# Patient Record
Sex: Female | Born: 1980 | Race: Black or African American | Hispanic: No | Marital: Single | Smoking: Current every day smoker
Health system: Southern US, Community
[De-identification: ages and names within clinical notes are randomized; demographics above are authoritative.]

## PROBLEM LIST (undated history)

## (undated) ENCOUNTER — Emergency Department (HOSPITAL_COMMUNITY): Admission: EM | Payer: Medicaid Other | Source: Home / Self Care

## (undated) DIAGNOSIS — I10 Essential (primary) hypertension: Secondary | ICD-10-CM

---

## 2010-06-19 ENCOUNTER — Ambulatory Visit: Payer: Self-pay | Admitting: Diagnostic Radiology

## 2010-06-19 ENCOUNTER — Emergency Department (HOSPITAL_BASED_OUTPATIENT_CLINIC_OR_DEPARTMENT_OTHER): Admission: EM | Admit: 2010-06-19 | Discharge: 2010-06-19 | Payer: Self-pay | Admitting: Emergency Medicine

## 2011-05-13 ENCOUNTER — Emergency Department (INDEPENDENT_AMBULATORY_CARE_PROVIDER_SITE_OTHER): Payer: Self-pay

## 2011-05-13 ENCOUNTER — Emergency Department (HOSPITAL_BASED_OUTPATIENT_CLINIC_OR_DEPARTMENT_OTHER)
Admission: EM | Admit: 2011-05-13 | Discharge: 2011-05-13 | Disposition: A | Discharge status: Home / Self Care | Payer: Self-pay | Attending: Emergency Medicine | Admitting: Emergency Medicine

## 2011-05-13 DIAGNOSIS — J40 Bronchitis, not specified as acute or chronic: Secondary | ICD-10-CM | POA: Insufficient documentation

## 2011-05-13 DIAGNOSIS — R059 Cough, unspecified: Secondary | ICD-10-CM | POA: Insufficient documentation

## 2011-05-13 DIAGNOSIS — R05 Cough: Secondary | ICD-10-CM

## 2011-05-13 LAB — URINALYSIS, ROUTINE W REFLEX MICROSCOPIC
Glucose, UA: NEGATIVE mg/dL
Hgb urine dipstick: NEGATIVE
Ketones, ur: NEGATIVE mg/dL
Protein, ur: NEGATIVE mg/dL

## 2011-10-18 ENCOUNTER — Emergency Department (INDEPENDENT_AMBULATORY_CARE_PROVIDER_SITE_OTHER): Payer: Medicaid Other

## 2011-10-18 ENCOUNTER — Emergency Department (HOSPITAL_BASED_OUTPATIENT_CLINIC_OR_DEPARTMENT_OTHER)
Admission: EM | Admit: 2011-10-18 | Discharge: 2011-10-18 | Disposition: A | Discharge status: Home / Self Care | Payer: Medicaid Other | Attending: Emergency Medicine | Admitting: Emergency Medicine

## 2011-10-18 ENCOUNTER — Encounter: Payer: Self-pay | Admitting: *Deleted

## 2011-10-18 DIAGNOSIS — O2690 Pregnancy related conditions, unspecified, unspecified trimester: Secondary | ICD-10-CM | POA: Insufficient documentation

## 2011-10-18 DIAGNOSIS — M545 Low back pain: Secondary | ICD-10-CM

## 2011-10-18 DIAGNOSIS — M549 Dorsalgia, unspecified: Secondary | ICD-10-CM | POA: Insufficient documentation

## 2011-10-18 DIAGNOSIS — O9989 Other specified diseases and conditions complicating pregnancy, childbirth and the puerperium: Secondary | ICD-10-CM

## 2011-10-18 DIAGNOSIS — Z3490 Encounter for supervision of normal pregnancy, unspecified, unspecified trimester: Secondary | ICD-10-CM

## 2011-10-18 LAB — URINALYSIS, ROUTINE W REFLEX MICROSCOPIC
Nitrite: NEGATIVE
Specific Gravity, Urine: 1.02 (ref 1.005–1.030)

## 2011-10-18 LAB — WET PREP, GENITAL
Trich, Wet Prep: NONE SEEN
Yeast Wet Prep HPF POC: NONE SEEN

## 2011-10-18 NOTE — ED Provider Notes (Signed)
History     CSN: [id number] Arrival date & time: 10/18/2011  5:58 PM   First MD Initiated Contact with Patient 10/18/11 1810      Chief Complaint  Patient presents with  . Back Pain    (Consider location/radiation/quality/duration/timing/severity/associated sxs/prior treatment) HPI Comments: Pt states that she has had some pain that radiates to her lower abdomen:pt states that she had a positive pregnancy test at home:pt state that she hasn't had a period since September:pt states that this her second pregnancy and she has one live child:pt denies dysuria or bleeding  Patient is a 30 y.o. female presenting with back pain. The history is provided by the patient. No language interpreter was used.  Back Pain  This is a new problem. The current episode started more than 1 week ago. The problem occurs daily. The problem has not changed since onset.The pain is associated with no known injury. The pain is present in the lumbar spine. The quality of the pain is described as aching. The pain does not radiate. The pain is mild. The pain is the same all the time. Pertinent negatives include no bladder incontinence, no dysuria, no tingling and no weakness. She has tried nothing for the symptoms.    History reviewed. No pertinent past medical history.  History reviewed. No pertinent past surgical history.  No family history on file.  History  Substance Use Topics  . Smoking status: Current Everyday Smoker  . Smokeless tobacco: Not on file  . Alcohol Use: No    OB History    Grav Para Term Preterm Abortions TAB SAB Ect Mult Living                  Review of Systems  Genitourinary: Negative for bladder incontinence and dysuria.  Musculoskeletal: Positive for back pain.  Neurological: Negative for tingling and weakness.  All other systems reviewed and are negative.    Allergies  Review of patient's allergies indicates no known allergies.  Home Medications  No current outpatient  prescriptions on file.  BP 125/81  Pulse 94  Temp(Src) 97.8 F (36.6 C) (Oral)  Resp 20  SpO2 100%  LMP 09/06/2011  Physical Exam  Nursing note and vitals reviewed. Constitutional: She is oriented to person, place, and time. She appears well-developed and well-nourished.  HENT:  Head: Normocephalic and atraumatic.  Eyes: Pupils are equal, round, and reactive to light.  Neck: Normal range of motion.  Cardiovascular: Normal rate and regular rhythm.   Pulmonary/Chest: Effort normal and breath sounds normal.  Abdominal: Soft. There is no tenderness. There is no CVA tenderness.  Genitourinary: No vaginal discharge found.       Os is closed:no bleeding noted  Musculoskeletal: Normal range of motion.  Neurological: She is alert and oriented to person, place, and time.  Skin: Skin is dry.  Psychiatric: She has a normal mood and affect.    ED Course  Procedures (including critical care time)  Labs Reviewed  URINALYSIS, ROUTINE W REFLEX MICROSCOPIC - Abnormal; Notable for the following:    Appearance CLOUDY (*)    All other components within normal limits  WET PREP, GENITAL - Abnormal; Notable for the following:    Clue Cells, Wet Prep FEW (*)    WBC, Wet Prep HPF POC RARE (*)    All other components within normal limits  PREGNANCY, URINE  GC/CHLAMYDIA PROBE AMP, GENITAL   US Ob Comp Less 14 Wks  10/18/2011  *RADIOLOGY REPORT*  Clinical Data: Positive  pregnancy test, unsure dates.  Low back pain  OBSTETRIC <14 WK [place] AND TRANSVAGINAL OB US  Technique:  Both transabdominal and transvaginal ultrasound examinations were performed for complete evaluation of the gestation as well as the maternal uterus, adnexal regions, and pelvic cul-de-sac.  Transvaginal technique was performed to assess early pregnancy.  Comparison:  None.  Intrauterine gestational sac:  Visualized/normal in shape Yolk sac: Visualized Embryo: Visualized Cardiac Activity: Visualized Heart Rate: 165 bpm  CRL: 2   mm  5    w  5   d           [place] EDC: 06/14/12  Maternal uterus/adnexae: The ovaries are normal bilaterally  IMPRESSION: Intrauterine gestational sac, yolk sac, fetal pole, and cardiac activity identified.  Gestational age [redacted] weeks 5 days by crown-rump length, EDC by ultrasound today 06/14/2012.  No acute abnormality.  Original Report Authenticated By: Harrel Lemon, M.D.   US Ob Transvaginal  10/18/2011  *RADIOLOGY REPORT*  Clinical Data: Positive pregnancy test, unsure dates.  Low back pain  OBSTETRIC <14 WK [place] AND TRANSVAGINAL OB US  Technique:  Both transabdominal and transvaginal ultrasound examinations were performed for complete evaluation of the gestation as well as the maternal uterus, adnexal regions, and pelvic cul-de-sac.  Transvaginal technique was performed to assess early pregnancy.  Comparison:  None.  Intrauterine gestational sac:  Visualized/normal in shape Yolk sac: Visualized Embryo: Visualized Cardiac Activity: Visualized Heart Rate: 165 bpm  CRL: 2   mm  5   w  5   d           [place] EDC: 06/14/12  Maternal uterus/adnexae: The ovaries are normal bilaterally  IMPRESSION: Intrauterine gestational sac, yolk sac, fetal pole, and cardiac activity identified.  Gestational age [redacted] weeks 5 days by crown-rump length, EDC by ultrasound today 06/14/2012.  No acute abnormality.  Original Report Authenticated By: Harrel Lemon, M.D.     1. Normal IUP (intrauterine pregnancy) on prenatal ultrasound       MDM  Consistent with ZOX:WRUE normal:pt okay to start getting prenatal care        Teressa Lower, NP 10/18/11 1951

## 2011-10-18 NOTE — ED Notes (Signed)
Pt c/o low back pain x2-3 weeks. She sts she took a home pregnancy test today and the result was positive.

## 2011-10-18 NOTE — ED Provider Notes (Signed)
Medical screening examination/treatment/procedure(s) were performed by non-physician practitioner and as supervising physician I was immediately available for consultation/collaboration.  Geoffery Lyons, MD 10/18/11 2142

## 2011-10-19 LAB — GC/CHLAMYDIA PROBE AMP, GENITAL
Chlamydia, DNA Probe: NEGATIVE
GC Probe Amp, Genital: NEGATIVE

## 2013-01-16 ENCOUNTER — Emergency Department (HOSPITAL_BASED_OUTPATIENT_CLINIC_OR_DEPARTMENT_OTHER)
Admission: EM | Admit: 2013-01-16 | Discharge: 2013-01-16 | Disposition: A | Discharge status: Home / Self Care | Payer: Medicaid Other | Attending: Emergency Medicine | Admitting: Emergency Medicine

## 2013-01-16 ENCOUNTER — Encounter (HOSPITAL_BASED_OUTPATIENT_CLINIC_OR_DEPARTMENT_OTHER): Payer: Self-pay | Admitting: Emergency Medicine

## 2013-01-16 DIAGNOSIS — H6690 Otitis media, unspecified, unspecified ear: Secondary | ICD-10-CM | POA: Insufficient documentation

## 2013-01-16 DIAGNOSIS — F17200 Nicotine dependence, unspecified, uncomplicated: Secondary | ICD-10-CM | POA: Insufficient documentation

## 2013-01-16 MED ORDER — AMOXICILLIN 500 MG PO CAPS: 500.0000 mg | ORAL_CAPSULE | Freq: Three times a day (TID) | ORAL | Status: DC

## 2013-01-16 MED ORDER — HYDROCODONE-ACETAMINOPHEN 5-325 MG PO TABS: 2.0000 | ORAL_TABLET | ORAL | Status: AC | PRN

## 2013-01-16 NOTE — ED Provider Notes (Signed)
Medical screening examination/treatment/procedure(s) were performed by non-physician practitioner and as supervising physician I was immediately available for consultation/collaboration.   Celene Kras, MD 01/16/13 2116

## 2013-01-16 NOTE — ED Provider Notes (Signed)
History     CSN: [id number]  Arrival date & time 01/16/13  1919   First MD Initiated Contact with Patient 01/16/13 2025      Chief Complaint  Patient presents with  . Otalgia    (Consider location/radiation/quality/duration/timing/severity/associated sxs/prior treatment) Patient is a 32 y.o. female presenting with ear pain. The history is provided by the patient. No language interpreter was used.  Otalgia This is a new problem. The current episode started 2 days ago. There is pain in the right ear. The problem occurs constantly. The problem has been gradually worsening. There has been no fever. The pain is at a severity of 5/10. The pain is moderate.    History reviewed. No pertinent past medical history.  History reviewed. No pertinent past surgical history.  History reviewed. No pertinent family history.  History  Substance Use Topics  . Smoking status: Current Every Day Smoker -- 0.5 packs/day    Types: Cigarettes  . Smokeless tobacco: Not on file  . Alcohol Use: No    OB History    Grav Para Term Preterm Abortions TAB SAB Ect Mult Living                  Review of Systems  HENT: Positive for ear pain.   All other systems reviewed and are negative.    Allergies  Review of patient's allergies indicates no known allergies.  Home Medications  No current outpatient prescriptions on file.  BP 141/94  Pulse 77  Temp 98.3 F (36.8 C) (Oral)  Resp 18  Ht 5\' 4"  (1.626 m)  Wt 200 lb (90.719 kg)  BMI 34.33 kg/m2  SpO2 100%  LMP 01/02/2013  Physical Exam  Nursing note and vitals reviewed. Constitutional: She appears well-developed and well-nourished.  HENT:  Head: Normocephalic and atraumatic.       Right tm,  Erythema bulging   Pain with opening and closing jaw.  Tender tmj area  Eyes: Pupils are equal, round, and reactive to light.  Neck: Normal range of motion. Neck supple.  Cardiovascular: Normal rate and normal heart sounds.   Pulmonary/Chest: Effort  normal.  Musculoskeletal: Normal range of motion.  Neurological: She is alert. She has normal reflexes.  Skin: Skin is warm.  Psychiatric: She has a normal mood and affect.    ED Course  Procedures (including critical care time)  Labs Reviewed - No data to display No results found.   No diagnosis found.    MDM  Pt given rx for amoxicillins and hydrocodone Pt advised follow up with Dr. Suszanne Conners for recheck if pain persist        Lonia Skinner [employer], [place] 01/16/13 2111

## 2013-01-16 NOTE — ED Notes (Signed)
Pt reports that she developed ear pain on Sunday, but ear only hurts when she swallows, pt reports having tooth pulled on left upper side of mouth on Friday., no drainage, no fevers, + cough, + nasal congestion

## 2013-01-16 NOTE — ED Notes (Signed)
ED PAC at bedside. 

## 2017-01-05 ENCOUNTER — Emergency Department (HOSPITAL_COMMUNITY): Payer: Medicaid Other

## 2017-01-05 ENCOUNTER — Emergency Department (HOSPITAL_COMMUNITY)
Admission: EM | Admit: 2017-01-05 | Discharge: 2017-01-05 | Disposition: A | Discharge status: Home / Self Care | Payer: Medicaid Other | Attending: Emergency Medicine | Admitting: Emergency Medicine

## 2017-01-05 ENCOUNTER — Encounter (HOSPITAL_COMMUNITY): Payer: Self-pay | Admitting: *Deleted

## 2017-01-05 DIAGNOSIS — F17210 Nicotine dependence, cigarettes, uncomplicated: Secondary | ICD-10-CM | POA: Diagnosis not present

## 2017-01-05 DIAGNOSIS — J069 Acute upper respiratory infection, unspecified: Secondary | ICD-10-CM | POA: Diagnosis not present

## 2017-01-05 DIAGNOSIS — R05 Cough: Secondary | ICD-10-CM | POA: Diagnosis present

## 2017-01-05 MED ORDER — BENZONATATE 100 MG PO CAPS: 100.0000 mg | ORAL_CAPSULE | Freq: Three times a day (TID) | ORAL | 0 refills | Status: DC

## 2017-01-05 MED ORDER — IBUPROFEN 800 MG PO TABS
800.0000 mg | ORAL_TABLET | Freq: Three times a day (TID) | ORAL | 0 refills | Status: DC
Start: 2017-01-05 — End: 2018-08-24

## 2017-01-05 MED ORDER — HYDROCHLOROTHIAZIDE 25 MG PO TABS
25.0000 mg | ORAL_TABLET | Freq: Every day | ORAL | 0 refills | Status: DC
Start: 2017-01-05 — End: 2018-08-24

## 2017-01-05 NOTE — ED Notes (Signed)
Called pt x4 with no answer.  

## 2017-01-05 NOTE — ED Notes (Signed)
Pt is in stable condition upon d/c and ambulates from ED. 

## 2017-01-05 NOTE — ED Notes (Signed)
Pt brought to room from xray.

## 2017-01-05 NOTE — Discharge Instructions (Signed)
Your symptoms are consistent with a viral illness. Viruses do not require antibiotics. Treatment is symptomatic care and it is important to note that these symptoms may last for 7-14 days. Symptoms will be intensified and complicated by dehydration. Dehydration can also extend the duration of symptoms. Drink plenty of fluids and get plenty of rest. You should be drinking at least half a liter of water an hour to stay hydrated. Electrolyte drinks are also encouraged. You should be drinking enough fluids to make your urine light yellow, almost clear. If this is not the case, you are not drinking enough water. Ibuprofen, Naproxen, or Tylenol for pain or fever. Tessalon for cough. Plain Mucinex may help relieve congestion. Saline sinus rinses and saline nasal sprays may also help relieve congestion. Warm liquids or Chloraseptic spray may help soothe a sore throat. Follow up with a primary care provider, as needed, for any future management of this issue. °

## 2017-01-05 NOTE — ED Provider Notes (Signed)
[facility] Provider Note   CSN: [id number] Arrival date & time: 01/05/17  1528  By signing my name below, I, Vista Mink, attest that this documentation has been prepared under the direction and in the presence of Shawn Joy PA-C.  Electronically Signed: Vista Mink, ED Scribe. 01/05/17. 5:27 PM.  History   Chief Complaint Chief Complaint  Patient presents with  . Cough    HPI HPI Comments: Brooke Barker is a 36 y.o. female who presents to the Emergency Department complaining of persistent productive cough with green sputum that started two days ago. Pt also notes nasal congestion and mild sore throat. Pt reports exacerbation of pain in her chest wall during episodes of repeated coughing. She took OTC Theraflu two days ago with mild relief of symptoms. She is also requesting a refill of her HCTZ 25mg  as she is new to the area and has not yet established a PCP. Denies fever/chills, nausea/vomiting, shortness of breath, or any other complaints.    The history is provided by the patient. No language interpreter was used.   History reviewed. No pertinent past medical history.  There are no active problems to display for this patient.   History reviewed. No pertinent surgical history.  OB History    No data available       Home Medications    Prior to Admission medications   Medication Sig Start Date End Date Taking? Authorizing Provider  amoxicillin (AMOXIL) 500 MG capsule Take 1 capsule (500 mg total) by mouth 3 (three) times daily. 01/16/13   Elson Areas, PA-C  benzonatate (TESSALON) 100 MG capsule Take 1 capsule (100 mg total) by mouth every 8 (eight) hours. 01/05/17   Shawn C Joy, PA-C  hydrochlorothiazide (HYDRODIURIL) 25 MG tablet Take 1 tablet (25 mg total) by mouth daily. 01/05/17   Shawn C Joy, PA-C  ibuprofen (ADVIL,MOTRIN) 800 MG tablet Take 1 tablet (800 mg total) by mouth 3 (three) times daily. 01/05/17   Anselm Pancoast, PA-C    Family History History  reviewed. No pertinent family history.  Social History Social History  Substance Use Topics  . Smoking status: Current Every Day Smoker    Packs/day: 0.50    Types: Cigarettes  . Smokeless tobacco: Not on file  . Alcohol use No   Allergies   Patient has no known allergies.   Review of Systems Review of Systems  Constitutional: Negative for chills and fever.  HENT: Positive for congestion and sore throat.   Respiratory: Positive for cough. Negative for shortness of breath.   Gastrointestinal: Negative for nausea and vomiting.  All other systems reviewed and are negative.  Physical Exam Updated Vital Signs BP 150/95 (BP Location: Left Arm)   Pulse 94   Temp 98.6 F (37 C) (Oral)   Resp 18   LMP 12/27/2016   SpO2 100%   Physical Exam  Constitutional: She appears well-developed and well-nourished. No distress.  HENT:  Head: Normocephalic and atraumatic.  Eyes: Conjunctivae are normal.  Neck: Neck supple.  Cardiovascular: Normal rate, regular rhythm, normal heart sounds and intact distal pulses.   Pulmonary/Chest: Effort normal and breath sounds normal. No respiratory distress.  Abdominal: Soft. There is no tenderness. There is no guarding.  Musculoskeletal: She exhibits no edema.  Lymphadenopathy:    She has no cervical adenopathy.  Neurological: She is alert.  Skin: Skin is warm and dry. She is not diaphoretic.  Psychiatric: She has a normal mood and affect. Her behavior is normal.  Nursing note and vitals reviewed.   ED Treatments / Results  DIAGNOSTIC STUDIES: Oxygen Saturation is 100% on RA, normal by my interpretation.  COORDINATION OF CARE: 5:26 PM-Discussed treatment plan with pt at bedside and pt agreed to plan.   Labs (all labs ordered are listed, but only abnormal results are displayed) Labs Reviewed - No data to display  EKG  EKG Interpretation None       Radiology No results found.  Procedures Procedures (including critical care  time)  Medications Ordered in ED Medications - No data to display   Initial Impression / Assessment and Plan / ED Course  I have reviewed the triage vital signs and the nursing notes.  Pertinent labs & imaging results that were available during my care of the patient were reviewed by me and considered in my medical decision making (see chart for details).      Patient presents with symptoms consistent with URI versus influenza-like illness. Negative chest x-ray. Supportive care and return precautions discussed. Patient's medication refilled and resources given for PCP follow-up.     Final Clinical Impressions(s) / ED Diagnoses   Final diagnoses:  Upper respiratory tract infection, unspecified type    New Prescriptions Discharge Medication List as of 01/05/2017  5:33 PM    START taking these medications   Details  benzonatate (TESSALON) 100 MG capsule Take 1 capsule (100 mg total) by mouth every 8 (eight) hours., Starting Wed 01/05/2017, Print    hydrochlorothiazide (HYDRODIURIL) 25 MG tablet Take 1 tablet (25 mg total) by mouth daily., Starting Wed 01/05/2017, Print    ibuprofen (ADVIL,MOTRIN) 800 MG tablet Take 1 tablet (800 mg total) by mouth 3 (three) times daily., Starting Wed 01/05/2017, Print      I personally performed the services described in this documentation, which was scribed in my presence. The recorded information has been reviewed and is accurate.    Anselm PancoastShawn C Joy, PA-C 01/08/17 0229    Lavera Guiseana Duo Liu, MD 01/11/17 [phone]

## 2017-01-05 NOTE — ED Triage Notes (Signed)
Pt reports having a headache, productive cough, sore throat, bodyaches for several days. Back pain with breathing and cough. Airway intact with no acute distress.

## 2018-08-24 ENCOUNTER — Encounter (HOSPITAL_COMMUNITY): Payer: Self-pay

## 2018-08-24 ENCOUNTER — Ambulatory Visit (HOSPITAL_COMMUNITY)
Admission: EM | Admit: 2018-08-24 | Discharge: 2018-08-24 | Disposition: A | Discharge status: Home / Self Care | Payer: BLUE CROSS/BLUE SHIELD | Attending: Family Medicine | Admitting: Family Medicine

## 2018-08-24 DIAGNOSIS — N898 Other specified noninflammatory disorders of vagina: Secondary | ICD-10-CM | POA: Diagnosis present

## 2018-08-24 DIAGNOSIS — R42 Dizziness and giddiness: Secondary | ICD-10-CM | POA: Insufficient documentation

## 2018-08-24 DIAGNOSIS — J029 Acute pharyngitis, unspecified: Secondary | ICD-10-CM | POA: Insufficient documentation

## 2018-08-24 DIAGNOSIS — J069 Acute upper respiratory infection, unspecified: Secondary | ICD-10-CM | POA: Diagnosis present

## 2018-08-24 DIAGNOSIS — R05 Cough: Secondary | ICD-10-CM | POA: Diagnosis present

## 2018-08-24 DIAGNOSIS — R51 Headache: Secondary | ICD-10-CM | POA: Diagnosis not present

## 2018-08-24 DIAGNOSIS — B9689 Other specified bacterial agents as the cause of diseases classified elsewhere: Secondary | ICD-10-CM | POA: Diagnosis not present

## 2018-08-24 DIAGNOSIS — N760 Acute vaginitis: Secondary | ICD-10-CM | POA: Diagnosis not present

## 2018-08-24 DIAGNOSIS — Z87440 Personal history of urinary (tract) infections: Secondary | ICD-10-CM | POA: Insufficient documentation

## 2018-08-24 DIAGNOSIS — F17210 Nicotine dependence, cigarettes, uncomplicated: Secondary | ICD-10-CM | POA: Diagnosis not present

## 2018-08-24 DIAGNOSIS — I10 Essential (primary) hypertension: Secondary | ICD-10-CM | POA: Diagnosis not present

## 2018-08-24 DIAGNOSIS — R300 Dysuria: Secondary | ICD-10-CM | POA: Diagnosis not present

## 2018-08-24 LAB — POCT URINALYSIS DIP (DEVICE)
Glucose, UA: NEGATIVE mg/dL
HGB URINE DIPSTICK: NEGATIVE
KETONES UR: NEGATIVE mg/dL
Leukocytes, UA: NEGATIVE
Nitrite: NEGATIVE
PH: 5.5 (ref 5.0–8.0)
PROTEIN: NEGATIVE mg/dL
SPECIFIC GRAVITY, URINE: 1.025 (ref 1.005–1.030)
Urobilinogen, UA: 1 mg/dL (ref 0.0–1.0)

## 2018-08-24 LAB — POCT RAPID STREP A: Streptococcus, Group A Screen (Direct): NEGATIVE

## 2018-08-24 MED ORDER — LISINOPRIL-HYDROCHLOROTHIAZIDE 10-12.5 MG PO TABS: 1.0000 | ORAL_TABLET | Freq: Every day | ORAL | 0 refills | Status: DC

## 2018-08-24 MED ORDER — METRONIDAZOLE 500 MG PO TABS: 500.0000 mg | ORAL_TABLET | Freq: Two times a day (BID) | ORAL | 0 refills | Status: DC

## 2018-08-24 NOTE — ED Triage Notes (Signed)
Pt presents with urinary tract infection symptoms; urine discolored, foul smell, discharge, frequent urination and pressure.

## 2018-08-24 NOTE — ED Provider Notes (Signed)
[facility]    CSN: [id number] Arrival date & time: 08/24/18  0854     History   Chief Complaint Chief Complaint  Patient presents with  . URI  . Urinary Tract Infection    HPI Brooke Barker is a 37 y.o. female.   HPI  Patient is here for 2 problems.  First she is had cough cold runny nose and sore throat for 2 days.  No fever chills.  No nausea or vomiting.  No exposure to anyone with illness or strep.  She has been using over-the-counter medication.  The symptoms are stable, not improving.  No sputum production.  No chest pain.  Underlying asthma or allergies. She also has a vaginal discharge, strong vaginal odor, and mild dysuria.  She absolutely denies exposure to sexually transmitted disease.  She has some frequency.  She wonders if she has a bladder or vaginal infection.  She has no pelvic pain. She mentions that she is out of her blood pressure medication.  She would like to have a refill of her medicine until she can get back in with her PCP.  She states she has had a change in insurance, and has not taken her medicine for about 2 weeks.  Headaches, dizzy spells, problems with symptomatic hypertension.  History reviewed. No pertinent past medical history.  There are no active problems to display for this patient.   History reviewed. No pertinent surgical history.  OB History   None      Home Medications    Prior to Admission medications   Medication Sig Start Date End Date Taking? Authorizing Provider  lisinopril-hydrochlorothiazide (ZESTORETIC) 10-12.5 MG tablet Take 1 tablet by mouth daily. 08/24/18   Eustace MooreNelson, Christinea Brizuela Sue, MD  metroNIDAZOLE (FLAGYL) 500 MG tablet Take 1 tablet (500 mg total) by mouth 2 (two) times daily. 08/24/18   Eustace MooreNelson, Khyrin Trevathan Sue, MD    Family History History reviewed. No pertinent family history.  Social History Social History   Tobacco Use  . Smoking status: Current Every Day Smoker    Packs/day: 0.50    Types: Cigarettes   Substance Use Topics  . Alcohol use: No  . Drug use: No     Allergies   Patient has no known allergies.   Review of Systems Review of Systems  Constitutional: Negative for appetite change, chills and fever.  HENT: Positive for congestion, postnasal drip, rhinorrhea and sore throat. Negative for ear pain and trouble swallowing.   Eyes: Negative for pain, redness and visual disturbance.  Respiratory: Positive for cough. Negative for shortness of breath.   Cardiovascular: Negative for chest pain and palpitations.  Gastrointestinal: Negative for abdominal pain, diarrhea, nausea and vomiting.  Genitourinary: Positive for dysuria and vaginal discharge. Negative for frequency, hematuria and pelvic pain.  Musculoskeletal: Negative for arthralgias and back pain.  Skin: Negative for color change and rash.  Neurological: Negative for seizures and syncope.  All other systems reviewed and are negative.    Physical Exam Triage Vital Signs ED Triage Vitals [08/24/18 0924]  Enc Vitals Group     BP (!) 167/103     Pulse Rate 98     Resp 18     Temp 98.5 F (36.9 C)     Temp Source Oral     SpO2 99 %     Weight    No data found.  Updated Vital Signs BP (!) 167/103 (BP Location: Left Arm)   Pulse 98   Temp 98.5 F (36.9  C) (Oral)   Resp 18   LMP 08/01/2018   SpO2 99%       Physical Exam  Constitutional: She appears well-developed and well-nourished. No distress.  HENT:  Head: Normocephalic and atraumatic.  Right Ear: External ear normal.  Left Ear: External ear normal.  Mouth/Throat: Oropharynx is clear and moist.  Nasal membranes congested.  Clear rhinorrhea.  Posterior pharynx is benign.  No adenopathy.  Eyes: Pupils are equal, round, and reactive to light. Conjunctivae are normal.  Neck: Normal range of motion.  Cardiovascular: Normal rate, regular rhythm and normal heart sounds.  Pulmonary/Chest: Effort normal and breath sounds normal. No respiratory distress. She  has no wheezes. She has no rales.  Abdominal: Soft. Bowel sounds are normal. She exhibits no distension. There is no tenderness.  Abdomen soft and benign  Musculoskeletal: Normal range of motion. She exhibits no edema.  Lymphadenopathy:    She has no cervical adenopathy.  Neurological: She is alert.  Skin: Skin is warm and dry.  Psychiatric: She has a normal mood and affect. Her behavior is normal.     UC Treatments / Results  Labs (all labs ordered are listed, but only abnormal results are displayed) Labs Reviewed  POCT URINALYSIS DIP (DEVICE) - Abnormal; Notable for the following components:      Result Value   Bilirubin Urine SMALL (*)    All other components within normal limits  URINE CULTURE  POCT RAPID STREP A    EKG None  Radiology No results found.  Procedures Procedures (including critical care time)  Medications Ordered in UC Medications - No data to display  Initial Impression / Assessment and Plan / UC Course  I have reviewed the triage vital signs and the nursing notes.  Pertinent labs & imaging results that were available during my care of the patient were reviewed by me and considered in my medical decision making (see chart for details).     Discussed BV.  Treatment.  Causes.  Reasons for return. Discussed viral URI.  Symptomatic treatment.  That antibiotics for the URI will not make her get better faster and can be harmful. Final Clinical Impressions(s) / UC Diagnoses   Final diagnoses:  BV (bacterial vaginosis)  Acute upper respiratory infection  Essential hypertension     Discharge Instructions     Drink plenty of fluids.  Take cold medicine as needed.  The symptoms should go away in a couple days. Take the antibiotic twice a day for 10 days.  This is for bacterial vaginosis. Do not drink alcohol while on the antibiotic.  Avoid sexual relations to you have completed your treatment. I have refilled your lisinopril blood pressure  medication.  I have placed a PCP referral to help you find a doctor. Return here as needed   ED Prescriptions    Medication Sig Dispense Auth. Provider   lisinopril-hydrochlorothiazide (ZESTORETIC) 10-12.5 MG tablet Take 1 tablet by mouth daily. 30 tablet Eustace Moore, MD   metroNIDAZOLE (FLAGYL) 500 MG tablet Take 1 tablet (500 mg total) by mouth 2 (two) times daily. 14 tablet Eustace Moore, MD     Controlled Substance Prescriptions [place] Controlled Substance Registry consulted? Not Applicable   Eustace Moore, MD 08/24/18 1017

## 2018-08-24 NOTE — ED Triage Notes (Signed)
Pt presents with cold symptoms; nasal drainage, facial pain, congestion,sore throat and persistent cough

## 2018-08-24 NOTE — Discharge Instructions (Signed)
Drink plenty of fluids.  Take cold medicine as needed.  The symptoms should go away in a couple days. Take the antibiotic twice a day for 10 days.  This is for bacterial vaginosis. Do not drink alcohol while on the antibiotic.  Avoid sexual relations to you have completed your treatment. I have refilled your lisinopril blood pressure medication.  I have placed a PCP referral to help you find a doctor. Return here as needed

## 2018-08-25 LAB — URINE CULTURE

## 2018-12-27 ENCOUNTER — Emergency Department (HOSPITAL_COMMUNITY)
Admission: EM | Admit: 2018-12-27 | Discharge: 2018-12-27 | Disposition: A | Discharge status: Home / Self Care | Payer: BLUE CROSS/BLUE SHIELD | Attending: Emergency Medicine | Admitting: Emergency Medicine

## 2018-12-27 ENCOUNTER — Other Ambulatory Visit: Payer: Self-pay

## 2018-12-27 ENCOUNTER — Encounter (HOSPITAL_COMMUNITY): Payer: Self-pay | Admitting: Emergency Medicine

## 2018-12-27 DIAGNOSIS — F17210 Nicotine dependence, cigarettes, uncomplicated: Secondary | ICD-10-CM | POA: Diagnosis not present

## 2018-12-27 DIAGNOSIS — I10 Essential (primary) hypertension: Secondary | ICD-10-CM | POA: Insufficient documentation

## 2018-12-27 DIAGNOSIS — R05 Cough: Secondary | ICD-10-CM | POA: Diagnosis present

## 2018-12-27 DIAGNOSIS — R69 Illness, unspecified: Secondary | ICD-10-CM

## 2018-12-27 DIAGNOSIS — Z79899 Other long term (current) drug therapy: Secondary | ICD-10-CM | POA: Insufficient documentation

## 2018-12-27 DIAGNOSIS — J111 Influenza due to unidentified influenza virus with other respiratory manifestations: Secondary | ICD-10-CM | POA: Insufficient documentation

## 2018-12-27 MED ORDER — BENZONATATE 100 MG PO CAPS: 100.0000 mg | ORAL_CAPSULE | Freq: Three times a day (TID) | ORAL | 0 refills | Status: DC

## 2018-12-27 MED ORDER — LISINOPRIL-HYDROCHLOROTHIAZIDE 10-12.5 MG PO TABS: 1.0000 | ORAL_TABLET | Freq: Every day | ORAL | 0 refills | Status: DC

## 2018-12-27 MED ORDER — ONDANSETRON 4 MG PO TBDP: 4.0000 mg | ORAL_TABLET | Freq: Three times a day (TID) | ORAL | 0 refills | Status: DC | PRN

## 2018-12-27 MED ORDER — OSELTAMIVIR PHOSPHATE 75 MG PO CAPS: 75.0000 mg | ORAL_CAPSULE | Freq: Two times a day (BID) | ORAL | 0 refills | Status: DC

## 2018-12-27 MED ORDER — ACETAMINOPHEN 500 MG PO TABS
1000.0000 mg | ORAL_TABLET | Freq: Once | ORAL | Status: AC
  Administered 2018-12-27: 1000 mg via ORAL
  Filled 2018-12-27: qty 2

## 2018-12-27 NOTE — Discharge Instructions (Signed)
You can take Tylenol or Ibuprofen as directed for pain. You can alternate Tylenol and Ibuprofen every 4 hours. If you take Tylenol at 1pm, then you can take Ibuprofen at 5pm. Then you can take Tylenol again at 9pm.   You can take Tamiflu as directed.  This may make you have some GI symptoms.  I provided Zofran to help with nausea.  Make sure you are drinking plenty of fluids and staying hydrated.  As we discussed, follow-up with the [facility] to establish primary care.  I provided you with a repeat PT prescription of your blood pressure medication.  Please follow-up with the [facility] to get this refilled.  Return emergency department for any difficulty breathing, worsening chest pain, fever despite medications, vomiting or any other worsening or concerning symptoms.

## 2018-12-27 NOTE — ED Triage Notes (Signed)
Patient c/o cough, congestion, body aches and headaches x2 days. Denies N/V/D.

## 2018-12-27 NOTE — ED Provider Notes (Signed)
[facility]-EMERGENCY DEPT Provider Note   CSN: [id number] Arrival date & time: 12/27/18  1400     History   Chief Complaint Chief Complaint  Patient presents with  . Cough    HPI Brooke Barker is a 38 y.o. female past 1 history of hypertension who presents for evaluation of 2 days of generalized body aches, cough, congestion, chest soreness.  Patient reports that she was fine the day before her symptoms began and that they suddenly started.  Patient states cough is not productive.  She has not measured any fever.  Patient does report that she has chest soreness with coughing.  She reports it is slightly sore at rest but no chest pain or difficulty breathing at rest.  Patient reports she is tried NyQuil and Alka-Seltzer without any improvement in symptoms.  Patient states she has not had any nausea, vomiting, diarrhea but does endorse decreased appetite.  She reports that she is drinking fluids without any difficulty.  Patient states that she does smoke approximately half pack of cigarettes a day.  She does not vape.  She denies any history of asthma. She denies any OCP use, recent immobilization, prior history of DVT/PE, recent surgery, leg swelling, or long travel.  The history is provided by the patient.    History reviewed. No pertinent past medical history.  There are no active problems to display for this patient.   History reviewed. No pertinent surgical history.   OB History   No obstetric history on file.      Home Medications    Prior to Admission medications   Medication Sig Start Date End Date Taking? Authorizing Provider  benzonatate (TESSALON) 100 MG capsule Take 1 capsule (100 mg total) by mouth every 8 (eight) hours. 12/27/18   Maxwell CaulLayden,  A, PA-C  lisinopril-hydrochlorothiazide (ZESTORETIC) 10-12.5 MG tablet Take 1 tablet by mouth daily. 12/27/18   Maxwell CaulLayden,  A, PA-C  metroNIDAZOLE (FLAGYL) 500 MG tablet Take 1 tablet (500 mg  total) by mouth 2 (two) times daily. 08/24/18   Eustace MooreNelson, Yvonne Sue, MD  ondansetron (ZOFRAN ODT) 4 MG disintegrating tablet Take 1 tablet (4 mg total) by mouth every 8 (eight) hours as needed for nausea or vomiting. 12/27/18   Maxwell CaulLayden,  A, PA-C  oseltamivir (TAMIFLU) 75 MG capsule Take 1 capsule (75 mg total) by mouth every 12 (twelve) hours. 12/27/18   Maxwell CaulLayden,  A, PA-C    Family History No family history on file.  Social History Social History   Tobacco Use  . Smoking status: Current Every Day Smoker    Packs/day: 0.50    Types: Cigarettes  Substance Use Topics  . Alcohol use: No  . Drug use: No     Allergies   Patient has no known allergies.   Review of Systems Review of Systems  Constitutional: Positive for appetite change and fatigue. Negative for fever.  HENT: Positive for congestion and rhinorrhea.   Respiratory: Positive for cough. Negative for shortness of breath.   Cardiovascular: Negative for chest pain.  Gastrointestinal: Negative for abdominal pain, nausea and vomiting.  Musculoskeletal: Positive for myalgias.  Neurological: Negative for headaches.  All other systems reviewed and are negative.    Physical Exam Updated Vital Signs BP (!) 128/96 (BP Location: Right Arm)   Pulse 92   Temp (!) 103 F (39.4 C) (Oral)   Resp 20   Ht 5\' 4"  (1.626 m)   Wt 101.2 kg   LMP 12/15/2018 (Approximate)   SpO2  98%   BMI 38.28 kg/m   Physical Exam Vitals signs and nursing note reviewed.  Constitutional:      Appearance: She is well-developed.  HENT:     Head: Normocephalic and atraumatic.     Nose: Congestion present.     Comments: Edematous and erythematous nasal turbinates bilaterally.    Mouth/Throat:     Mouth: Mucous membranes are moist.     Pharynx: Oropharynx is clear.     Comments: Uvula is midline.  No trismus. Airway is patent, phonation is intact. Eyes:     General: No scleral icterus.       Right eye: No discharge.        Left eye: No  discharge.     Conjunctiva/sclera: Conjunctivae normal.  Cardiovascular:     Rate and Rhythm: Normal rate and regular rhythm.     Pulses:          Radial pulses are 2+ on the right side and 2+ on the left side.  Pulmonary:     Effort: Pulmonary effort is normal.     Breath sounds: Normal breath sounds.     Comments: Lungs clear to auscultation bilaterally.  Symmetric chest rise.  No wheezing, rales, rhonchi. Chest:     Chest wall: Tenderness present.     Comments: Tenderness palpation of the midsternal chest area.  No deformity or crepitus noted. Skin:    General: Skin is warm and dry.  Neurological:     Mental Status: She is alert.  Psychiatric:        Speech: Speech normal.        Behavior: Behavior normal.      ED Treatments / Results  Labs (all labs ordered are listed, but only abnormal results are displayed) Labs Reviewed - No data to display  EKG None  Radiology No results found.  Procedures Procedures (including critical care time)  Medications Ordered in ED Medications  acetaminophen (TYLENOL) tablet 1,000 mg (1,000 mg Oral Given 12/27/18 1700)     Initial Impression / Assessment and Plan / ED Course  I have reviewed the triage vital signs and the nursing notes.  Pertinent labs & imaging results that were available during my care of the patient were reviewed by me and considered in my medical decision making (see chart for details).     38 year old female who presents for evaluation of 2 days of cough, body aches, congestion, chest soreness.  Reports that she was fine prior to onset of symptoms.  No difficulty breathing.  She does smoke approximately half pack of cigarettes a day. Patient is afebrile, non-toxic appearing, sitting comfortably on examination table. Vital signs reviewed and stable.  Patient is hypertensive.  She does have a history of hypertension states she ran out of her blood pressure medications.  Consider influenzaI.  History/physical exam  not concerning for pneumonia.  History/physical exam not concerning for ACS etiology, PE.  Discussed with patient that she may have some groove costochondritis from coughing, additionally pain is reproduced on palpation.  Given that patient is in the 48-hour window of treatment, did offer Tamiflu.  Instructed patient on at home supportive care measures. At this time, patient exhibits no emergent life-threatening condition that require further evaluation in ED or admission.  Patient also requesting refill of her blood pressure medication.  Patient with bottle of lisinopril-HCTZ with her.  Was last filled on 11/14/2018.  Patient reports that she has not been able to get a primary care since then.  I discussed with patient that I could give her a short course refill but she will need to follow-up with her of primary care doctors for further dosages.  We will give her Coumadin wellness clinic for further evaluation. Patient had ample opportunity for questions and discussion. All patient's questions were answered with full understanding. Strict return precautions discussed. Patient expresses understanding and agreement to plan.    RN informed me that on patient's repeat vitals, patient now with fever.  Patient given Tylenol here in the ED and encourage p.o. fluids.  Portions of this note were generated with Scientist, clinical (histocompatibility and immunogenetics)Dragon dictation software. Dictation errors may occur despite best attempts at proofreading.   Final Clinical Impressions(s) / ED Diagnoses   Final diagnoses:  Influenza-like illness  Essential hypertension    ED Discharge Orders         Ordered    lisinopril-hydrochlorothiazide (ZESTORETIC) 10-12.5 MG tablet  Daily     12/27/18 1639    benzonatate (TESSALON) 100 MG capsule  Every 8 hours     12/27/18 1639    oseltamivir (TAMIFLU) 75 MG capsule  Every 12 hours     12/27/18 1639    ondansetron (ZOFRAN ODT) 4 MG disintegrating tablet  Every 8 hours PRN     12/27/18 1639           Rosana HoesLayden,   A, PA-C 12/27/18 2047    Benjiman CorePickering, Nathan, MD 12/27/18 2219

## 2019-12-02 ENCOUNTER — Emergency Department (HOSPITAL_BASED_OUTPATIENT_CLINIC_OR_DEPARTMENT_OTHER)
Admission: EM | Admit: 2019-12-02 | Discharge: 2019-12-02 | Disposition: A | Discharge status: Home / Self Care | Payer: Medicaid Other | Attending: Emergency Medicine | Admitting: Emergency Medicine

## 2019-12-02 ENCOUNTER — Emergency Department (HOSPITAL_BASED_OUTPATIENT_CLINIC_OR_DEPARTMENT_OTHER): Payer: Medicaid Other

## 2019-12-02 ENCOUNTER — Encounter (HOSPITAL_BASED_OUTPATIENT_CLINIC_OR_DEPARTMENT_OTHER): Payer: Self-pay | Admitting: Emergency Medicine

## 2019-12-02 ENCOUNTER — Other Ambulatory Visit: Payer: Self-pay

## 2019-12-02 DIAGNOSIS — Y929 Unspecified place or not applicable: Secondary | ICD-10-CM | POA: Insufficient documentation

## 2019-12-02 DIAGNOSIS — W19XXXA Unspecified fall, initial encounter: Secondary | ICD-10-CM

## 2019-12-02 DIAGNOSIS — Y999 Unspecified external cause status: Secondary | ICD-10-CM | POA: Insufficient documentation

## 2019-12-02 DIAGNOSIS — W108XXA Fall (on) (from) other stairs and steps, initial encounter: Secondary | ICD-10-CM | POA: Diagnosis not present

## 2019-12-02 DIAGNOSIS — Z79899 Other long term (current) drug therapy: Secondary | ICD-10-CM | POA: Insufficient documentation

## 2019-12-02 DIAGNOSIS — Y939 Activity, unspecified: Secondary | ICD-10-CM | POA: Diagnosis not present

## 2019-12-02 DIAGNOSIS — F17210 Nicotine dependence, cigarettes, uncomplicated: Secondary | ICD-10-CM | POA: Diagnosis not present

## 2019-12-02 DIAGNOSIS — S0242XA Fracture of alveolus of maxilla, initial encounter for closed fracture: Secondary | ICD-10-CM | POA: Diagnosis not present

## 2019-12-02 DIAGNOSIS — S01511A Laceration without foreign body of lip, initial encounter: Secondary | ICD-10-CM

## 2019-12-02 DIAGNOSIS — Z23 Encounter for immunization: Secondary | ICD-10-CM | POA: Diagnosis not present

## 2019-12-02 MED ORDER — PENICILLIN V POTASSIUM 500 MG PO TABS: 500.0000 mg | ORAL_TABLET | Freq: Four times a day (QID) | ORAL | 0 refills | Status: AC

## 2019-12-02 MED ORDER — IBUPROFEN 600 MG PO TABS: 600.0000 mg | ORAL_TABLET | Freq: Four times a day (QID) | ORAL | 0 refills | Status: DC | PRN

## 2019-12-02 MED ORDER — TETANUS-DIPHTH-ACELL PERTUSSIS 5-2.5-18.5 LF-MCG/0.5 IM SUSP
0.5000 mL | Freq: Once | INTRAMUSCULAR | Status: AC
  Administered 2019-12-02: 0.5 mL via INTRAMUSCULAR
  Filled 2019-12-02: qty 0.5

## 2019-12-02 MED ORDER — LIDOCAINE HCL (PF) 1 % IJ SOLN
5.0000 mL | Freq: Once | INTRAMUSCULAR | Status: DC
  Administered 2019-12-02: 5 mL
  Filled 2019-12-02: qty 5

## 2019-12-02 MED ORDER — HYDROCODONE-ACETAMINOPHEN 5-325 MG PO TABS: 1.0000 | ORAL_TABLET | Freq: Four times a day (QID) | ORAL | 0 refills | Status: DC | PRN

## 2019-12-02 MED ORDER — HYDROCODONE-ACETAMINOPHEN 5-325 MG PO TABS
1.0000 | ORAL_TABLET | Freq: Once | ORAL | Status: AC
  Administered 2019-12-02: 1 via ORAL
  Filled 2019-12-02: qty 1

## 2019-12-02 NOTE — ED Provider Notes (Signed)
[facility] EMERGENCY DEPARTMENT Provider Note   CSN: [id number] Arrival date & time: 12/02/19  1314     History Chief Complaint  Patient presents with  . Fall    Brooke Barker is a 38 y.o. female who is previously healthy who presents with lip and tooth pain after fall last evening around 11:30 PM.  Patient reports she tripped up the stairs and hit her face on concrete steps.  She did not lose consciousness.  She denies headache, nausea, vomiting.  She reports displacement of her upper tooth and lip laceration.  She denies any other complaints.  Her tetanus is not up-to-date.  HPI     History reviewed. No pertinent past medical history.  There are no problems to display for this patient.   Past Surgical History:  Procedure Laterality Date  . CESAREAN SECTION       OB History   No obstetric history on file.     No family history on file.  Social History   Tobacco Use  . Smoking status: Current Every Day Smoker    Packs/day: 0.50    Types: Cigarettes  . Smokeless tobacco: Never Used  Substance Use Topics  . Alcohol use: Yes  . Drug use: No    Home Medications Prior to Admission medications   Medication Sig Start Date End Date Taking? Authorizing Provider  benzonatate (TESSALON) 100 MG capsule Take 1 capsule (100 mg total) by mouth every 8 (eight) hours. 12/27/18   Maxwell Caul, PA-C  HYDROcodone-acetaminophen (NORCO/VICODIN) 5-325 MG tablet Take 1-2 tablets by mouth every 6 (six) hours as needed for severe pain. 12/02/19   Spiro Ausborn, Waylan Boga, PA-C  ibuprofen (ADVIL) 600 MG tablet Take 1 tablet (600 mg total) by mouth every 6 (six) hours as needed. 12/02/19   Amiere Cawley, Waylan Boga, PA-C  lisinopril-hydrochlorothiazide (ZESTORETIC) 10-12.5 MG tablet Take 1 tablet by mouth daily. 12/27/18   Maxwell Caul, PA-C  metroNIDAZOLE (FLAGYL) 500 MG tablet Take 1 tablet (500 mg total) by mouth 2 (two) times daily. 08/24/18   Eustace Moore, MD  ondansetron  (ZOFRAN ODT) 4 MG disintegrating tablet Take 1 tablet (4 mg total) by mouth every 8 (eight) hours as needed for nausea or vomiting. 12/27/18   Maxwell Caul, PA-C  oseltamivir (TAMIFLU) 75 MG capsule Take 1 capsule (75 mg total) by mouth every 12 (twelve) hours. 12/27/18   Graciella Freer A, PA-C  penicillin v potassium (VEETID) 500 MG tablet Take 1 tablet (500 mg total) by mouth 4 (four) times daily for 7 days. 12/02/19 12/09/19  Emi Holes, PA-C    Allergies    Patient has no known allergies.  Review of Systems   Review of Systems  Constitutional: Negative for chills and fever.  HENT: Positive for dental problem. Negative for facial swelling and sore throat.   Respiratory: Negative for shortness of breath.   Cardiovascular: Negative for chest pain.  Gastrointestinal: Negative for abdominal pain, nausea and vomiting.  Genitourinary: Negative for dysuria.  Musculoskeletal: Negative for back pain.  Skin: Positive for wound. Negative for rash.  Neurological: Negative for syncope and headaches.  Psychiatric/Behavioral: The patient is not nervous/anxious.     Physical Exam Updated Vital Signs BP (!) 175/114 (BP Location: Right Arm)   Pulse (!) 101   Temp 99 F (37.2 C) (Oral)   Resp 20   Ht 5\' 6"  (1.676 m)   Wt 100 kg   LMP 11/08/2019   SpO2 99%  BMI 35.58 kg/m   Physical Exam Vitals and nursing note reviewed.  Constitutional:      General: She is not in acute distress.    Appearance: She is well-developed. She is not diaphoretic.  HENT:     Head: Normocephalic and atraumatic.     Mouth/Throat:     Pharynx: No oropharyngeal exudate.      Comments: Laceration to the right side lower lip that crosses the mucosal line, but not the vermilion border; not through and through, edge is fairly macerated and crusted Eyes:     General: No scleral icterus.       Right eye: No discharge.        Left eye: No discharge.     Conjunctiva/sclera: Conjunctivae normal.      Pupils: Pupils are equal, round, and reactive to light.  Neck:     Thyroid: No thyromegaly.  Cardiovascular:     Rate and Rhythm: Normal rate and regular rhythm.     Heart sounds: Normal heart sounds. No murmur. No friction rub. No gallop.   Pulmonary:     Effort: Pulmonary effort is normal. No respiratory distress.     Breath sounds: Normal breath sounds. No stridor. No wheezing or rales.  Abdominal:     General: Bowel sounds are normal. There is no distension.     Palpations: Abdomen is soft.     Tenderness: There is no abdominal tenderness. There is no guarding or rebound.  Musculoskeletal:     Cervical back: Normal range of motion and neck supple.  Lymphadenopathy:     Cervical: No cervical adenopathy.  Skin:    General: Skin is warm and dry.     Coloration: Skin is not pale.     Findings: No rash.  Neurological:     Mental Status: She is alert.     Coordination: Coordination normal.     Comments: CN 3-12 intact; normal sensation throughout; 5/5 strength in all 4 extremities; equal bilateral grip strength     ED Results / Procedures / Treatments   Labs (all labs ordered are listed, but only abnormal results are displayed) Labs Reviewed - No data to display  EKG None  Radiology CT Maxillofacial Wo Contrast  Result Date: 12/02/2019 CLINICAL DATA:  38 year old female status post fall onto concrete step EXAM: CT MAXILLOFACIAL WITHOUT CONTRAST TECHNIQUE: Multidetector CT imaging of the maxillofacial structures was performed. Multiplanar CT image reconstructions were also generated. COMPARISON:  None. FINDINGS: Osseous: Focal avulsion fracture of the anterior cortex of the alveolar ridge overlying the right central incisor. Additionally, the right lateral incisor is displaced anteriorly with respect to its normal position. No additional fractures are identified. Orbits: Negative. No traumatic or inflammatory finding. Sinuses: Mucoperiosteal thickening present in the right  maxillary sinus and throughout the ethmoid air cells. Small mucous retention cyst versus polyp in the posterior aspect of the left maxillary sinus. Missing left lateral incisor appears to be chronic. Soft tissues: Negative. Limited intracranial: No significant or unexpected finding. IMPRESSION: 1. Positive for focal fracture of the anterior cortex of the maxillary alveolar ridge overlying the right central incisor. 2. The root of the right lateral incisor is also displaced anteriorly with respect to the tooth socket consisted with posterior displacement of the body of the tooth and anterior displacement of the root. 3. No mandibular fracture identified. Electronically Signed   By: Malachy MoanHeath  McCullough M.D.   On: 12/02/2019 14:42    Procedures Procedures (including critical care time)  Medications  Ordered in ED Medications  Tdap (BOOSTRIX) injection 0.5 mL (0.5 mLs Intramuscular Given 12/02/19 1424)  HYDROcodone-acetaminophen (NORCO/VICODIN) 5-325 MG per tablet 1 tablet (1 tablet Oral Given 12/02/19 1647)    ED Course  I have reviewed the triage vital signs and the nursing notes.  Pertinent labs & imaging results that were available during my care of the patient were reviewed by me and considered in my medical decision making (see chart for details).  Clinical Course as of Dec 01 1702  Sun Dec 02, 2019  1453 CT Maxillofacial Wo Contrast [AL]    Clinical Course User Index [AL] Frederica Kuster, PA-C   MDM Rules/Calculators/A&P                      Patient presenting following fall last evening around 1130.  Patient found to have dental root fracture.  The tooth is stable.  It is not loose at all.  It does not appear to be open.  I attempted to contact a dentist on call, however could not get a hold of him.  Will refer to Dr. Haig Prophet, patient advised to call first thing in the morning for an appointment.  Patient also with lip laceration, mostly mucosal.  Considering length of time since  injury, I feel closure would increase risk of infection significantly.  Patient will be covered with penicillin for prophylaxis.  Will discharge home with ibuprofen and short course of Vicodin for pain.  Salt water swishes discussed.  Return precautions discussed.  Patient's blood pressure elevated, however history of hypertension probably increased due to pain.  Return precautions discussed.  Patient understands and agrees with plan.  Patient stable and discharged in satisfactory condition.  Patient also evaluated by my attending, Dr. Stark Jock, who guided the patient's management and agrees with plan.  Final Clinical Impression(s) / ED Diagnoses Final diagnoses:  Fall, initial encounter  Lip laceration, initial encounter  Closed fracture of alveolar ridge of right side of maxilla ([place])    Rx / DC Orders ED Discharge Orders         Ordered    penicillin v potassium (VEETID) 500 MG tablet  4 times daily     12/02/19 1626    HYDROcodone-acetaminophen (NORCO/VICODIN) 5-325 MG tablet  Every 6 hours PRN     12/02/19 1626    ibuprofen (ADVIL) 600 MG tablet  Every 6 hours PRN     12/02/19 [address] [place], PA-C 12/02/19 1703    Veryl Speak, MD 12/05/19 1511

## 2019-12-02 NOTE — Discharge Instructions (Addendum)
Take penicillin as prescribed until completed.  Take ibuprofen or 6 hours as needed for pain.  Take 1-2 Vicodin every 6 hours as needed for severe pain.  Swish with salt water for your lip laceration after eating each time.  It is important that you follow-up with dentist as soon as possible.  Please call tomorrow morning to make an appointment.  If you bring your paperwork to your appointment, [facility] may be able to help with your finances.  Please return to the emergency department if you develop any new or worsening symptoms.

## 2019-12-02 NOTE — ED Triage Notes (Signed)
Fell down concrete stairs last night around 1130. Denies LOC. Laceration noted to lower lip and states her tooth got pushed back.

## 2021-08-10 ENCOUNTER — Other Ambulatory Visit: Payer: Self-pay

## 2021-08-10 ENCOUNTER — Emergency Department (HOSPITAL_COMMUNITY)
Admission: EM | Admit: 2021-08-10 | Discharge: 2021-08-10 | Disposition: A | Discharge status: Home / Self Care | Payer: Medicaid Other | Attending: Emergency Medicine | Admitting: Emergency Medicine

## 2021-08-10 ENCOUNTER — Encounter (HOSPITAL_COMMUNITY): Payer: Self-pay | Admitting: Emergency Medicine

## 2021-08-10 ENCOUNTER — Emergency Department (HOSPITAL_COMMUNITY): Payer: Medicaid Other

## 2021-08-10 DIAGNOSIS — F17210 Nicotine dependence, cigarettes, uncomplicated: Secondary | ICD-10-CM | POA: Diagnosis not present

## 2021-08-10 DIAGNOSIS — R0789 Other chest pain: Secondary | ICD-10-CM | POA: Diagnosis not present

## 2021-08-10 DIAGNOSIS — Z79899 Other long term (current) drug therapy: Secondary | ICD-10-CM | POA: Diagnosis not present

## 2021-08-10 DIAGNOSIS — Y92410 Unspecified street and highway as the place of occurrence of the external cause: Secondary | ICD-10-CM | POA: Insufficient documentation

## 2021-08-10 DIAGNOSIS — I10 Essential (primary) hypertension: Secondary | ICD-10-CM | POA: Diagnosis not present

## 2021-08-10 DIAGNOSIS — M542 Cervicalgia: Secondary | ICD-10-CM | POA: Insufficient documentation

## 2021-08-10 DIAGNOSIS — S39012D Strain of muscle, fascia and tendon of lower back, subsequent encounter: Secondary | ICD-10-CM

## 2021-08-10 HISTORY — DX: Essential (primary) hypertension: I10

## 2021-08-10 MED ORDER — MORPHINE SULFATE (PF) 4 MG/ML IV SOLN
4.0000 mg | Freq: Once | INTRAVENOUS | Status: AC
  Administered 2021-08-10: 4 mg via INTRAMUSCULAR
  Filled 2021-08-10: qty 1

## 2021-08-10 MED ORDER — ONDANSETRON 4 MG PO TBDP
8.0000 mg | ORAL_TABLET | Freq: Once | ORAL | Status: AC
  Administered 2021-08-10: 8 mg via ORAL
  Filled 2021-08-10: qty 2

## 2021-08-10 MED ORDER — NAPROXEN 250 MG PO TABS: 250.0000 mg | ORAL_TABLET | Freq: Two times a day (BID) | ORAL | 0 refills | Status: DC
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
## 2021-10-01 ENCOUNTER — Ambulatory Visit: Payer: Medicaid Other | Admitting: Family Medicine

## 2021-10-12 ENCOUNTER — Encounter: Payer: Medicaid Other | Admitting: Obstetrics and Gynecology

## 2022-11-11 IMAGING — DX DG CHEST 2V
2 series · 2 of 2 positions shown · non-contrast
Comparison: Chest radiograph 01/05/2017

CLINICAL DATA: /pain

EXAM:
CHEST - 2 VIEW

[chest lat]
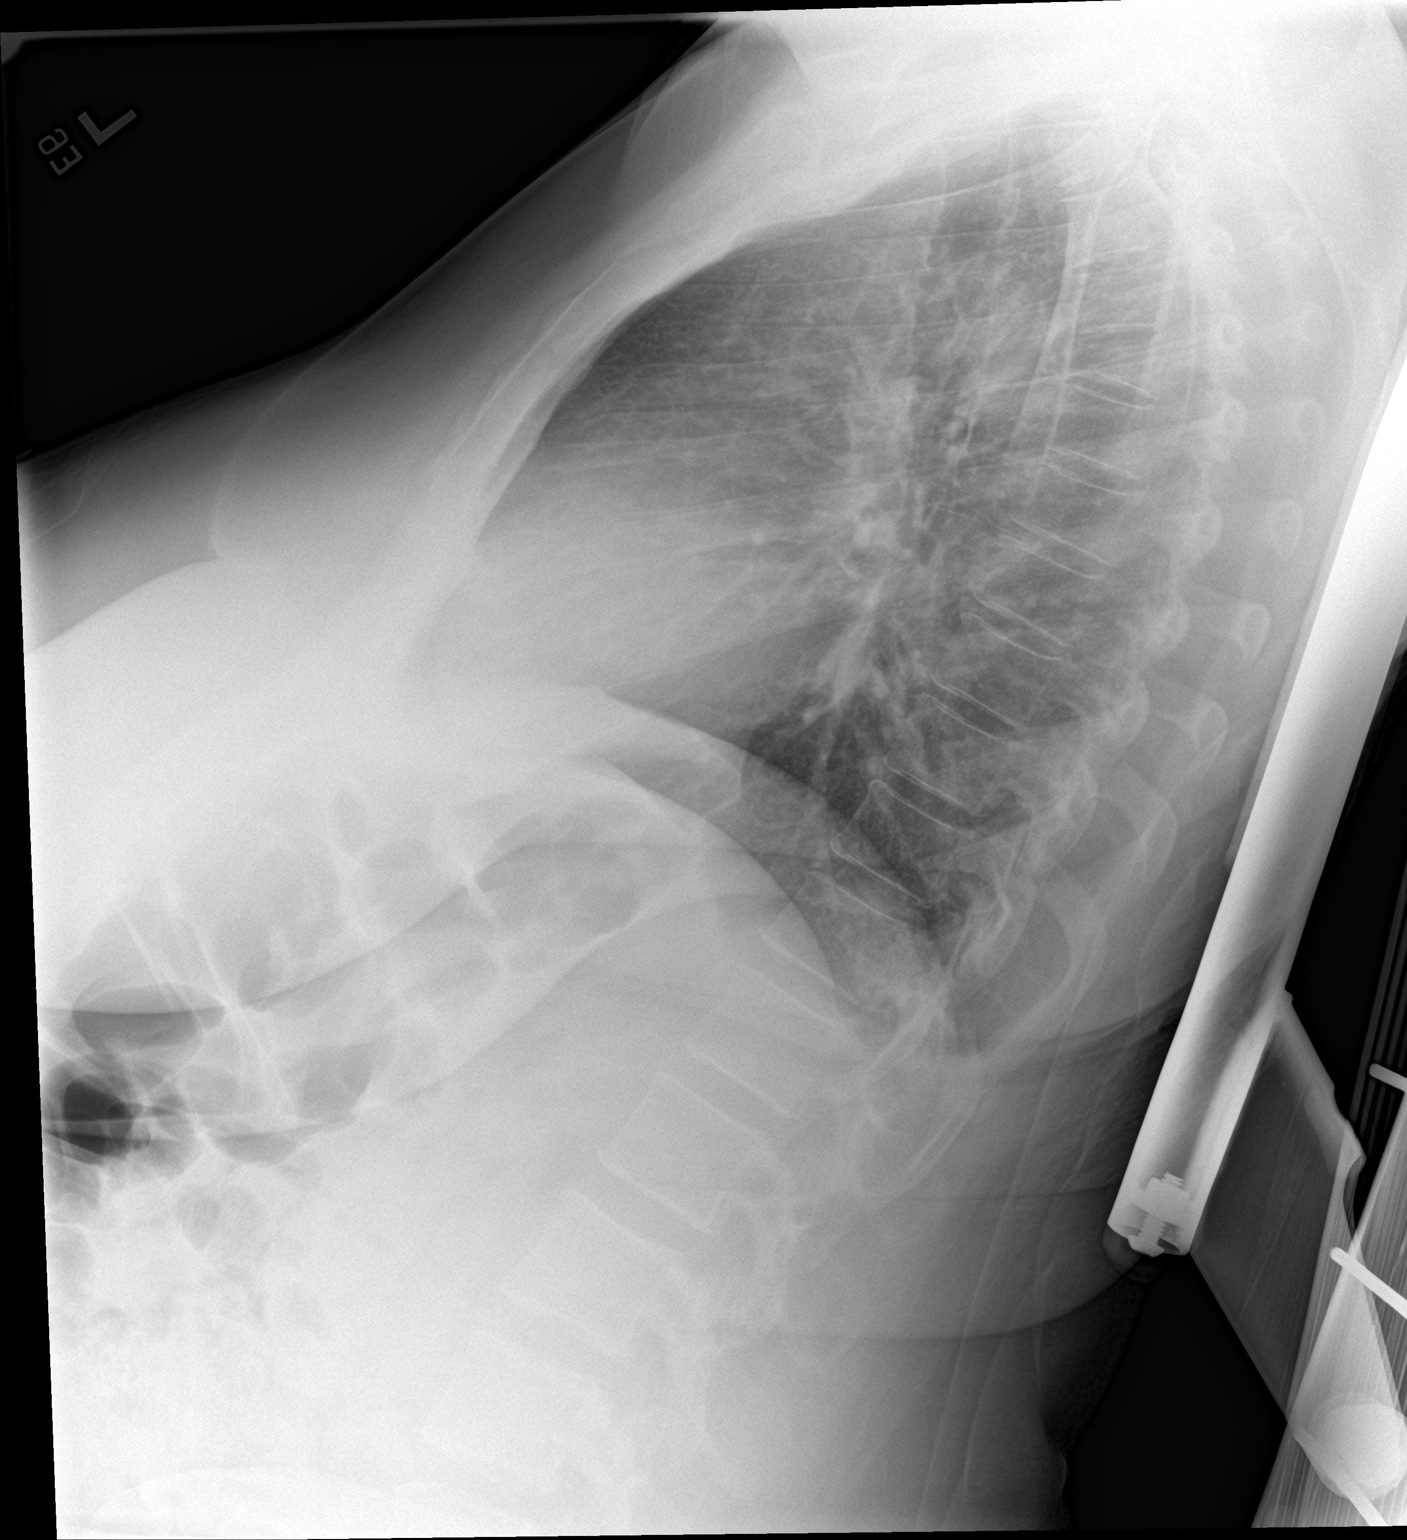

[chest ap]
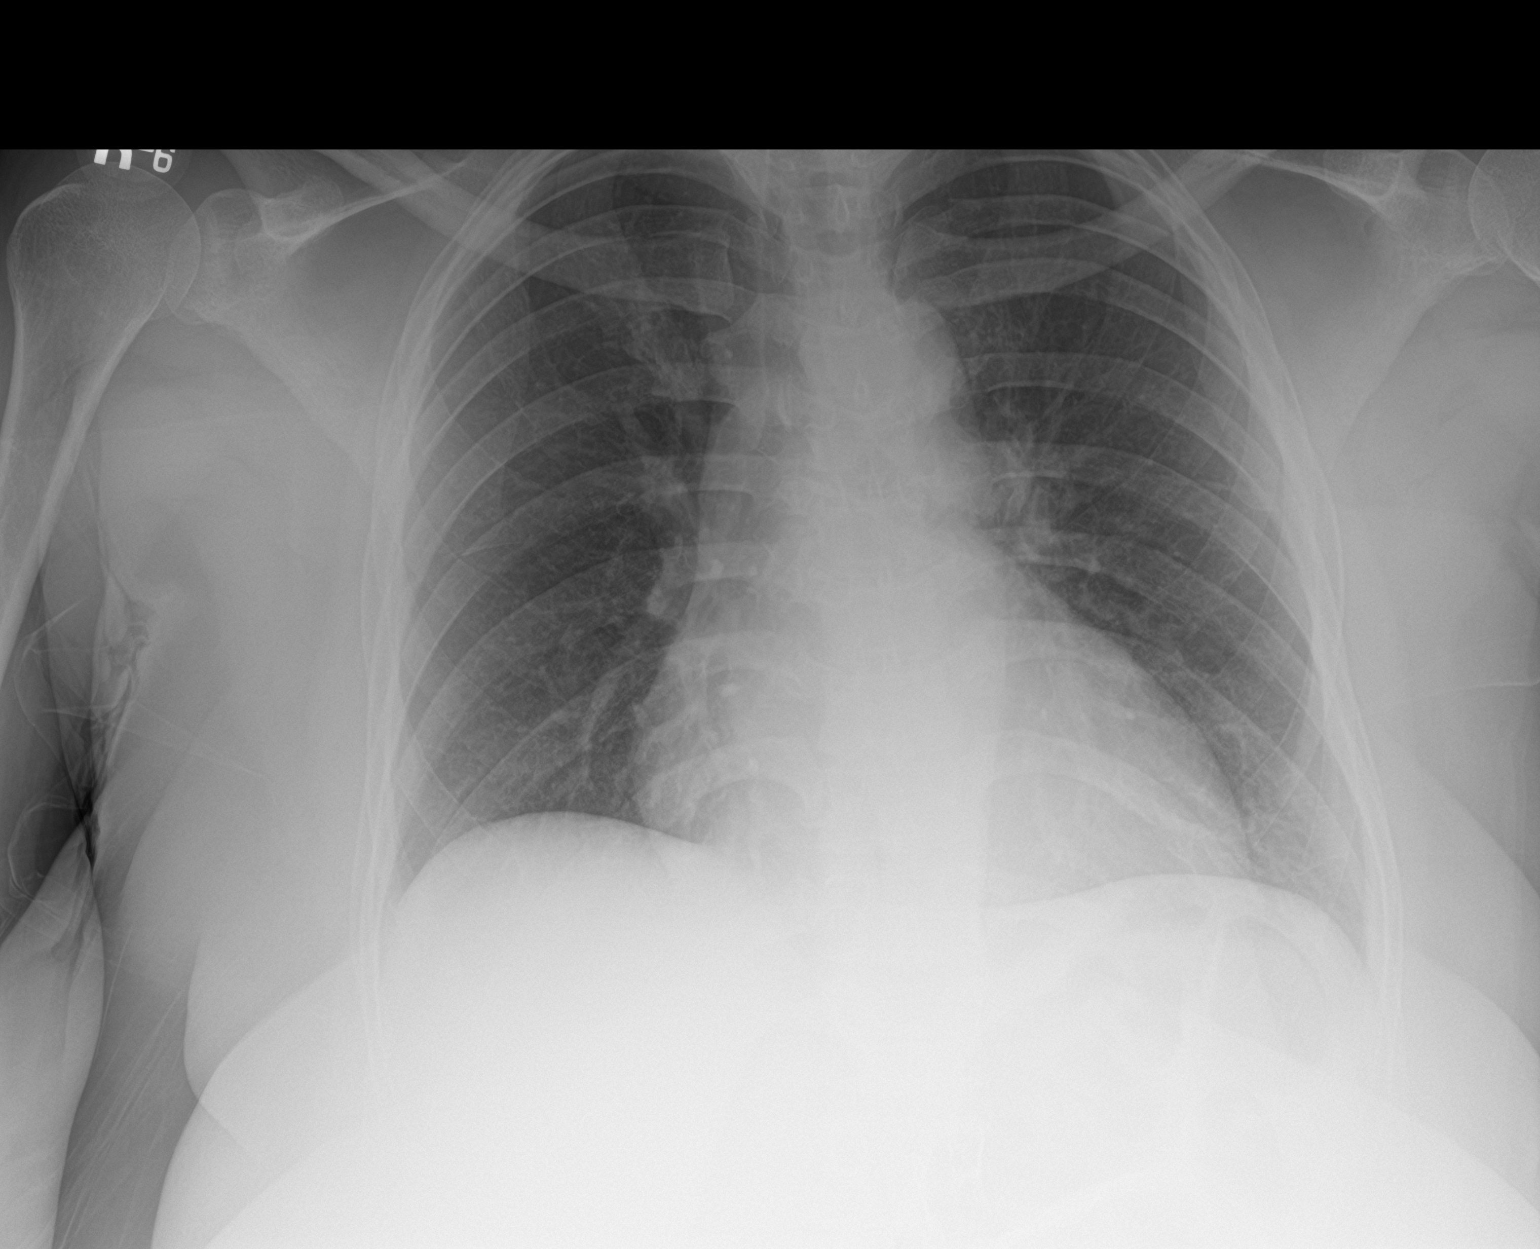

[2 of 2 positions shown; findings below may reference images not displayed]

FINDINGS: The cardiomediastinal silhouette is normal.

There is no focal consolidation or pulmonary edema. There is no
pleural effusion or pneumothorax.

The bones are unremarkable.
IMPRESSION: No radiographic evidence of acute cardiopulmonary process.

## 2024-07-09 ENCOUNTER — Telehealth (INDEPENDENT_AMBULATORY_CARE_PROVIDER_SITE_OTHER): Payer: Self-pay | Admitting: Primary Care

## 2024-07-09 NOTE — Telephone Encounter (Signed)
 Called pt to confirm appt. Pt did not answer and LVM

## 2024-07-10 ENCOUNTER — Encounter (INDEPENDENT_AMBULATORY_CARE_PROVIDER_SITE_OTHER): Payer: Self-pay | Admitting: Primary Care

## 2024-07-10 ENCOUNTER — Ambulatory Visit (INDEPENDENT_AMBULATORY_CARE_PROVIDER_SITE_OTHER): Admitting: Primary Care

## 2024-07-10 VITALS — BP 142/84 | HR 84 | Resp 16 | Ht 65.5 in | Wt 217.0 lb

## 2024-07-10 DIAGNOSIS — Z1159 Encounter for screening for other viral diseases: Secondary | ICD-10-CM | POA: Diagnosis not present

## 2024-07-10 DIAGNOSIS — Z716 Tobacco abuse counseling: Secondary | ICD-10-CM

## 2024-07-10 DIAGNOSIS — Z7689 Persons encountering health services in other specified circumstances: Secondary | ICD-10-CM

## 2024-07-10 DIAGNOSIS — F17210 Nicotine dependence, cigarettes, uncomplicated: Secondary | ICD-10-CM | POA: Diagnosis not present

## 2024-07-10 DIAGNOSIS — Z114 Encounter for screening for human immunodeficiency virus [HIV]: Secondary | ICD-10-CM | POA: Diagnosis not present

## 2024-07-10 DIAGNOSIS — I10 Essential (primary) hypertension: Secondary | ICD-10-CM | POA: Diagnosis not present

## 2024-07-10 DIAGNOSIS — Z13220 Encounter for screening for lipoid disorders: Secondary | ICD-10-CM | POA: Diagnosis not present

## 2024-07-10 DIAGNOSIS — E669 Obesity, unspecified: Secondary | ICD-10-CM | POA: Diagnosis not present

## 2024-07-10 DIAGNOSIS — G4700 Insomnia, unspecified: Secondary | ICD-10-CM | POA: Diagnosis not present

## 2024-07-10 DIAGNOSIS — G4733 Obstructive sleep apnea (adult) (pediatric): Secondary | ICD-10-CM | POA: Diagnosis not present

## 2024-07-10 DIAGNOSIS — Z6835 Body mass index (BMI) 35.0-35.9, adult: Secondary | ICD-10-CM

## 2024-07-10 MED ORDER — LISINOPRIL-HYDROCHLOROTHIAZIDE 20-25 MG PO TABS: 1.0000 | ORAL_TABLET | Freq: Every day | ORAL | 1 refills | Status: DC

## 2024-07-10 NOTE — Progress Notes (Signed)
 New Patient Office Visit  Subjective    Patient ID: Brooke Barker female  DOB: 30-Mar-1981  Age: 43 y.o. MRN: [id number]   CC:  Out of blood pressure medication,/hypertension, weight management screening and insomnia HPI     Hypertension    Additional comments: Pt has been out of medication for a month         Insomnia    Additional comments: Been having issues with sleeping for the last month of so       Last edited by Casimir Juvenal SAUNDERS, RMA on 07/10/2024 10:07 AM.      Hypertension This is a chronic problem. The current episode started more than 1 year ago. Progression since onset: on meds wnl. The problem is controlled. Associated symptoms include headaches, malaise/fatigue and shortness of breath. There are no associated agents to hypertension. Risk factors for coronary artery disease include obesity, family history, smoking/tobacco exposure and stress. Past treatments include ACE inhibitors and diuretics. There are no compliance problems.   Insomnia Primary symptoms: sleep disturbance, difficulty falling asleep, frequent awakening, malaise/fatigue.   The current episode started one month. The onset quality is undetermined. The problem has been gradually worsening since onset. The symptoms are aggravated by alcohol, caffeine, work stress and tobacco. How many beverages per day that contain caffeine: 2-3.  Types of beverages you drink: coffee and soda. Relieved by: none. Past treatments include nothing. How long after going to bed to you fall asleep: less than 15 minutes.   PMH includes: hypertension.  Prior diagnostic workup includes:  No prior workup.     Brooke Barker is a 43 year old obese female who presents today to establish care.  She has a diagnosis of hypertension and has been out of blood pressure medication elevated on this visit.  Weight management will be deferred until blood pressure is controlled.  She also has problems sleeping insomnia. No current  outpatient medications on file prior to visit.   No current facility-administered medications on file prior to visit.     No Known Allergies  Past Medical History:  Diagnosis Date   Hypertension      Past Surgical History:  Procedure Laterality Date   CESAREAN SECTION       Family History  Problem Relation Age of Onset   Hypertension Mother    Hypertension Father     Social History   Socioeconomic History   Marital status: Single    Spouse name: Not on file   Number of children: Not on file   Years of education: Not on file   Highest education level: Not on file  Occupational History   Not on file  Tobacco Use   Smoking status: Every Day    Current packs/day: 0.50    Types: Cigarettes   Smokeless tobacco: Never  Vaping Use   Vaping status: Never Used  Substance and Sexual Activity   Alcohol use: Yes   Drug use: No   Sexual activity: Not on file  Other Topics Concern   Not on file  Social History Narrative   Not on file   Social Drivers of Health   Financial Resource Strain: Not on file  Food Insecurity: Not on file  Transportation Needs: Not on file  Physical Activity: Not on file  Stress: Not on file  Social Connections: Not on file  Intimate Partner Violence: Not on file   Health Maintenance  Topic Date Due   HIV Screening  Never done   Hepatitis C  Screening  Never done   Pneumococcal Vaccination (1 of 2 - PCV) Never done   Hepatitis B Vaccine (1 of 3 - 19+ 3-dose series) Never done   HPV Vaccine (1 - 3-dose SCDM series) Never done   Pap with HPV screening  Never done   COVID-19 Vaccine (1 - 2024-25 season) Never done   Flu Shot  07/13/2024   DTaP/Tdap/Td vaccine (2 - Td or Tdap) 12/01/2029   Meningitis B Vaccine  Aged Out    Objective    BP (!) 140/96 (BP Location: Left Arm, Patient Position: Sitting, Cuff Size: Large)   Pulse 84   Resp 16   Ht 5' 5.5 (1.664 m)   Wt 217 lb (98.4 kg)   LMP 07/06/2024   SpO2 99%   BMI 35.56 kg/m   BP Readings from Last 3 Encounters:  07/10/24 (!) 140/96  08/10/21 (!) 154/99  12/02/19 (!) 175/114       Physical Exam Vitals reviewed.  Constitutional:      Appearance: Normal appearance. She is obese.  HENT:     Head: Normocephalic.     Right Ear: Tympanic membrane, ear canal and external ear normal.     Left Ear: Tympanic membrane, ear canal and external ear normal.     Nose: Nose normal.     Mouth/Throat:     Mouth: Mucous membranes are moist.  Eyes:     Extraocular Movements: Extraocular movements intact.     Pupils: Pupils are equal, round, and reactive to light.  Cardiovascular:     Rate and Rhythm: Normal rate and regular rhythm.  Pulmonary:     Effort: Pulmonary effort is normal.     Breath sounds: Normal breath sounds.  Abdominal:     General: Bowel sounds are normal.     Palpations: Abdomen is soft.  Musculoskeletal:        General: Normal range of motion.     Cervical back: Normal range of motion and neck supple.  Skin:    General: Skin is warm and dry.  Neurological:     Mental Status: She is alert and oriented to person, place, and time.  Psychiatric:        Mood and Affect: Mood normal.        Behavior: Behavior normal.        Thought Content: Thought content normal.       Assessment & Plan:  Brooke Barker was seen today for hypertension, weight management screening and insomnia.  Diagnoses and all orders for this visit:  Encounter to establish care  Essential hypertension BP goal - < 130/80 Explained that having normal blood pressure is the goal and medications are helping to get to goal and maintain normal blood pressure. DIET: Limit salt intake, read nutrition labels to check salt content, limit fried and high fatty foods  Avoid using multisymptom OTC cold preparations that generally contain sudafed which can rise BP. Consult with pharmacist on best cold relief products to use for persons with HTN EXERCISE Discussed incorporating exercise  such as walking - 30 minutes most days of the week and can do in 10 minute intervals    -     CBC with Differential/Platelet -     CMP14+EGFR  Insomnia, unspecified type -     Ambulatory referral to Pulmonology  Obesity (BMI 30-39.9) Obesity is 30-39 indicating an excess in caloric intake or underlining conditions. This may lead to other co-morbidities. Educated on lifestyle modifications of diet and exercise  which may reduce obesity.    OSA (obstructive sleep apnea) -     Ambulatory referral to Pulmonology  Tobacco abuse counseling - I have recommended complete cessation of tobacco use. I have discussed various options available for assistance with tobacco cessation including over the counter methods (Nicotine gum, patch and lozenges). We also discussed prescription options (Chantix, Nicotine Inhaler / Nasal Spray). The patient is not interested in pursuing any prescription tobacco cessation options at this time. - Patient declines at this time.  - Less than 5 minutes spent on counseling.   Encounter for HCV screening test for low risk patient -     HCV Ab w Reflex to Quant PCR  Encounter for screening for HIV -     HIV Antibody (routine testing w rflx)  Lipid screening -     Lipid panel  Other orders -     lisinopril -hydrochlorothiazide  (ZESTORETIC ) 20-25 MG tablet; Take 1 tablet by mouth daily.       Follow-up:  Return in about 4 weeks (around 08/07/2024), or weight management/, for re-check blood pressure.  The above assessment and management plan was discussed with the patient. The patient verbalized understanding of and has agreed to the management plan. Patient is aware to call the clinic if symptoms fail to improve or worsen. Patient is aware when to return to the clinic for a follow-up visit. Patient educated on when it is appropriate to go to the emergency department.   Rosaline Bohr, NP-C

## 2024-07-11 LAB — LIPID PANEL
Chol/HDL Ratio: 3.3 ratio (ref 0.0–4.4)
Cholesterol, Total: 197 mg/dL (ref 100–199)
HDL: 60 mg/dL (ref >39)
LDL Chol Calc (NIH): 121 mg/dL — ABNORMAL HIGH (ref 0–99)
Triglycerides: 87 mg/dL (ref 0–149)
VLDL Cholesterol Cal: 16 mg/dL (ref 5–40)

## 2024-07-11 LAB — CMP14+EGFR
ALT: 29 IU/L (ref 0–32)
AST: 25 IU/L (ref 0–40)
Albumin: 4.2 g/dL (ref 3.9–4.9)
Alkaline Phosphatase: 113 IU/L (ref 44–121)
BUN/Creatinine Ratio: 12 (ref 9–23)
BUN: 7 mg/dL (ref 6–24)
Bilirubin Total: 0.3 mg/dL (ref 0.0–1.2)
CO2: 22 mmol/L (ref 20–29)
Calcium: 9.3 mg/dL (ref 8.7–10.2)
Chloride: 104 mmol/L (ref 96–106)
Creatinine, Ser: 0.6 mg/dL (ref 0.57–1.00)
Globulin, Total: 2.6 g/dL (ref 1.5–4.5)
Glucose: 89 mg/dL (ref 70–99)
Potassium: 4.1 mmol/L (ref 3.5–5.2)
Sodium: 141 mmol/L (ref 134–144)
Total Protein: 6.8 g/dL (ref 6.0–8.5)
eGFR: 114 mL/min/1.73 (ref >59)

## 2024-07-11 LAB — CBC WITH DIFFERENTIAL/PLATELET
Basophils Absolute: 0 x10E3/uL (ref 0.0–0.2)
Basos: 1 %
EOS (ABSOLUTE): 0.1 x10E3/uL (ref 0.0–0.4)
Eos: 3 %
Hematocrit: 38.8 % (ref 34.0–46.6)
Hemoglobin: 12.2 g/dL (ref 11.1–15.9)
Immature Grans (Abs): 0 x10E3/uL (ref 0.0–0.1)
Immature Granulocytes: 0 %
Lymphocytes Absolute: 2.4 x10E3/uL (ref 0.7–3.1)
Lymphs: 43 %
MCH: 28.6 pg (ref 26.6–33.0)
MCHC: 31.4 g/dL — ABNORMAL LOW (ref 31.5–35.7)
MCV: 91 fL (ref 79–97)
Monocytes Absolute: 0.3 x10E3/uL (ref 0.1–0.9)
Monocytes: 6 %
Neutrophils Absolute: 2.7 x10E3/uL (ref 1.4–7.0)
Neutrophils: 47 %
Platelets: 351 x10E3/uL (ref 150–450)
RBC: 4.26 x10E6/uL (ref 3.77–5.28)
RDW: 14.8 % (ref 11.7–15.4)
WBC: 5.5 x10E3/uL (ref 3.4–10.8)

## 2024-07-11 LAB — HCV INTERPRETATION

## 2024-07-11 LAB — HIV ANTIBODY (ROUTINE TESTING W REFLEX): HIV Screen 4th Generation wRfx: NONREACTIVE

## 2024-07-11 LAB — HCV AB W REFLEX TO QUANT PCR: HCV Ab: NONREACTIVE

## 2024-07-12 ENCOUNTER — Ambulatory Visit: Payer: Self-pay | Admitting: Primary Care

## 2024-08-08 ENCOUNTER — Ambulatory Visit (INDEPENDENT_AMBULATORY_CARE_PROVIDER_SITE_OTHER): Admitting: Primary Care

## 2024-08-09 ENCOUNTER — Encounter: Payer: Self-pay | Admitting: Pulmonary Disease

## 2024-10-22 ENCOUNTER — Ambulatory Visit (INDEPENDENT_AMBULATORY_CARE_PROVIDER_SITE_OTHER): Admitting: Primary Care

## 2024-10-22 ENCOUNTER — Encounter (INDEPENDENT_AMBULATORY_CARE_PROVIDER_SITE_OTHER): Payer: Self-pay | Admitting: Primary Care

## 2024-10-22 ENCOUNTER — Other Ambulatory Visit (HOSPITAL_COMMUNITY)
Admission: RE | Admit: 2024-10-22 | Discharge: 2024-10-22 | Disposition: A | Source: Ambulatory Visit | Attending: Primary Care | Admitting: Primary Care

## 2024-10-22 VITALS — BP 138/90 | HR 117 | Resp 16 | Wt 220.8 lb

## 2024-10-22 DIAGNOSIS — E669 Obesity, unspecified: Secondary | ICD-10-CM | POA: Diagnosis not present

## 2024-10-22 DIAGNOSIS — Z124 Encounter for screening for malignant neoplasm of cervix: Secondary | ICD-10-CM

## 2024-10-22 DIAGNOSIS — I10 Essential (primary) hypertension: Secondary | ICD-10-CM | POA: Diagnosis not present

## 2024-10-22 DIAGNOSIS — Z1231 Encounter for screening mammogram for malignant neoplasm of breast: Secondary | ICD-10-CM | POA: Diagnosis not present

## 2024-10-22 MED ORDER — LISINOPRIL-HYDROCHLOROTHIAZIDE 20-25 MG PO TABS: 1.0000 | ORAL_TABLET | Freq: Every day | ORAL | 1 refills | Status: AC

## 2024-10-22 NOTE — Progress Notes (Signed)
 [facility]  WELL-WOMAN PHYSICAL & PAP Patient name: Brooke Barker MRN [id number]  Date of birth: 1981-09-20 Chief Complaint:   GYN visit  History of Present Illness:   Brooke Barker is a 43 y.o. No obstetric history on file. female being seen today for a routine well-woman exam.   CC GYN visit The current method of family planning is vaginal spermicide.  No LMP recorded. Last pap none. Last mammogram: nonr.  Family h/o breast cancer: No . Family h/o colorectal cancer: No  Health Maintenance  Topic Date Due   Hepatitis B Vaccine (1 of 3 - 19+ 3-dose series) Never done   HPV Vaccine (1 - 3-dose SCDM series) Never done   Breast Cancer Screening  Never done   Flu Shot  Never done   COVID-19 Vaccine (1 - 2025-26 season) Never done   Pneumococcal Vaccine (1 of 2 - PCV) 10/22/2025*   Pap with HPV screening  10/22/2029   DTaP/Tdap/Td vaccine (2 - Td or Tdap) 12/01/2029   Hepatitis C Screening  Completed   HIV Screening  Completed   Meningitis B Vaccine  Aged Out  *Topic was postponed. The date shown is not the original due date.   Review of Systems:    Denies any headaches, blurred vision, fatigue, shortness of breath, chest pain, abdominal pain, abnormal vaginal discharge/itching/odor/irritation, problems with periods, bowel movements, urination, or intercourse unless otherwise stated above.  Pertinent History Reviewed:   Reviewed past medical,surgical, social and family history.  Reviewed problem list, medications and allergies.  History Brooke Barker has a past medical history of Hypertension.   Brooke Barker has a past surgical history that includes Cesarean section.   Her family history includes Hypertension in her father and mother.Brooke Barker reports that Brooke Barker has been smoking cigarettes. Brooke Barker has never used smokeless tobacco. Brooke Barker reports current alcohol use. Brooke Barker reports that Brooke Barker does not use drugs.  No current outpatient medications on file prior to visit.   No current  facility-administered medications on file prior to visit.    Physical Assessment:   Vitals:   10/22/24 1649 10/22/24 1650  BP: (!) 149/110 (!) 138/90  Pulse: (!) 117   Resp: 16   SpO2: 100%   Weight: 220 lb 12.8 oz (100.2 kg)   Body mass index is 36.18 kg/m.        Physical Examination:  General appearance - well appearing, and in no distress Mental status - alert, oriented to person, place, and time Psych:  Brooke Barker has a normal mood and affect Skin - warm and dry, normal color, no suspicious lesions noted Chest - effort normal, all lung fields clear to auscultation bilaterally Heart - normal rate and regular rhythm Neck:  midline trachea, no thyromegaly or nodules Breasts - breasts appear normal, no suspicious masses, no skin or nipple changes or axillary nodes Educated patient on proper self breast examination and had patient to demonstrate SBE. Abdomen - soft, nontender, nondistended, no masses or organomegaly Pelvic-VULVA: normal appearing vulva with no masses, tenderness or lesions   VAGINA: normal appearing vagina with normal color and discharge, no lesions   CERVIX: normal appearing cervix without discharge or lesions, no CMT UTERUS: uterus is felt to be normal size, shape, consistency and nontender  ADNEXA: No adnexal masses or tenderness noted. Extremities:  No swelling or varicosities noted   Assessment & Plan:  Diagnoses and all orders for this visit:  Cervical cancer screening -     Cytology - PAP -     Cervicovaginal  ancillary only  Encounter for screening mammogram for malignant neoplasm of breast -     MM 3D SCREENING MAMMOGRAM BILATERAL BREAST; Future  Essential hypertension Elevated out of pills 4 days  BP goal - < 130/80 Explained that having normal blood pressure is the goal and medications are helping to get to goal and maintain normal blood pressure. DIET: Limit salt intake, read nutrition labels to check salt content, limit fried and high fatty foods   Avoid using multisymptom OTC cold preparations that generally contain sudafed which can rise BP. Consult with pharmacist on best cold relief products to use for persons with HTN EXERCISE Discussed incorporating exercise such as walking - 30 minutes most days of the week and can do in 10 minute intervals    -     lisinopril -hydrochlorothiazide  (ZESTORETIC ) 20-25 MG tablet; Take 1 tablet by mouth daily.  Obesity (BMI 30-39.9) Will discuss weight loss management in first of the year due to upcoming holidays.    Meds:  Meds ordered this encounter  Medications   lisinopril -hydrochlorothiazide  (ZESTORETIC ) 20-25 MG tablet    Sig: Take 1 tablet by mouth daily.    Dispense:  90 tablet    Refill:  1    Supervising Provider:   JEGEDE, OLUGBEMIGA E [[id number]]    Follow-up: Return in about 2 months (around 12/22/2024) for weight loss management.  This note has been created with Education officer, environmental. Any transcriptional errors are unintentional.   Brooke SHAUNNA Bohr, NP 10/28/2024, 2:33 AM

## 2024-10-24 LAB — CERVICOVAGINAL ANCILLARY ONLY
Bacterial Vaginitis (gardnerella): POSITIVE — AB
Candida Glabrata: NEGATIVE
Candida Vaginitis: NEGATIVE
Chlamydia: NEGATIVE
Comment: NEGATIVE
Comment: NEGATIVE
Comment: NEGATIVE
Comment: NEGATIVE
Comment: NEGATIVE
Comment: NORMAL
Neisseria Gonorrhea: NEGATIVE
Trichomonas: NEGATIVE

## 2024-10-25 ENCOUNTER — Ambulatory Visit (INDEPENDENT_AMBULATORY_CARE_PROVIDER_SITE_OTHER): Payer: Self-pay | Admitting: Primary Care

## 2024-10-25 LAB — CYTOLOGY - PAP
Comment: NEGATIVE
Comment: NEGATIVE
Comment: NEGATIVE
HPV 16: NEGATIVE
HPV 18 / 45: NEGATIVE
High risk HPV: POSITIVE — AB

## 2024-12-27 ENCOUNTER — Telehealth (INDEPENDENT_AMBULATORY_CARE_PROVIDER_SITE_OTHER): Payer: Self-pay | Admitting: Primary Care

## 2024-12-27 NOTE — Telephone Encounter (Signed)
 Called pt to confirm appt.Pt did not answer and could not LVM.

## 2024-12-28 ENCOUNTER — Ambulatory Visit (INDEPENDENT_AMBULATORY_CARE_PROVIDER_SITE_OTHER): Payer: Self-pay | Admitting: Primary Care
